# Patient Record
Sex: Female | Born: 1995 | Race: White | Hispanic: No | Marital: Single | Smoking: Former smoker
Health system: Southern US, Community
[De-identification: ages and names within clinical notes are randomized; demographics above are authoritative.]

## PROBLEM LIST (undated history)

## (undated) DIAGNOSIS — F32A Depression, unspecified: Secondary | ICD-10-CM

## (undated) DIAGNOSIS — F329 Major depressive disorder, single episode, unspecified: Secondary | ICD-10-CM

## (undated) HISTORY — DX: Depression, unspecified: F32.A

## (undated) HISTORY — DX: Major depressive disorder, single episode, unspecified: F32.9

---
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
## 2015-07-06 ENCOUNTER — Ambulatory Visit
Admission: RE | Admit: 2015-07-06 | Discharge: 2015-07-06 | Disposition: A | Discharge status: Home / Self Care | Payer: BC Managed Care – PPO | Source: Ambulatory Visit | Attending: Family Medicine | Admitting: Family Medicine

## 2015-07-06 ENCOUNTER — Other Ambulatory Visit: Payer: Self-pay | Admitting: Family Medicine

## 2015-07-06 DIAGNOSIS — S060X0A Concussion without loss of consciousness, initial encounter: Secondary | ICD-10-CM

## 2015-09-02 ENCOUNTER — Other Ambulatory Visit: Payer: Self-pay | Admitting: Orthopedic Surgery

## 2015-09-02 DIAGNOSIS — R52 Pain, unspecified: Secondary | ICD-10-CM

## 2015-09-02 DIAGNOSIS — R609 Edema, unspecified: Secondary | ICD-10-CM

## 2015-09-07 ENCOUNTER — Ambulatory Visit
Admission: RE | Admit: 2015-09-07 | Discharge: 2015-09-07 | Disposition: A | Discharge status: Home / Self Care | Payer: BC Managed Care – PPO | Source: Ambulatory Visit | Attending: Orthopedic Surgery | Admitting: Orthopedic Surgery

## 2015-09-07 DIAGNOSIS — R609 Edema, unspecified: Secondary | ICD-10-CM

## 2015-09-07 DIAGNOSIS — R52 Pain, unspecified: Secondary | ICD-10-CM

## 2016-11-14 HISTORY — PX: MENISCUS REPAIR: SHX5179

## 2017-11-03 IMAGING — MR MR KNEE*R* W/O CM
4 of 5 series · 32 of 40 positions shown · non-contrast
Comparison: None.

CLINICAL DATA: Medial right knee pain and difficulty bearing
weight. The patient had a twisting injury of the right knee 3 weeks
ago playing soccer. Initial encounter.

EXAM:
MRI OF THE RIGHT KNEE WITHOUT CONTRAST
TECHNIQUE: Multiplanar, multisequence MR imaging of the knee was performed. No
intravenous contrast was administered.

[Series 6: PD fat-sat · axial · right · 3.0mm · 0.39mm/px · z∈[-33,+81]mm · 9 of 36 slices shown (1 of 3)]
[im 1/36]
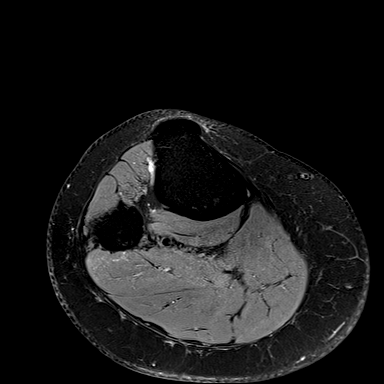
[im 5/36]
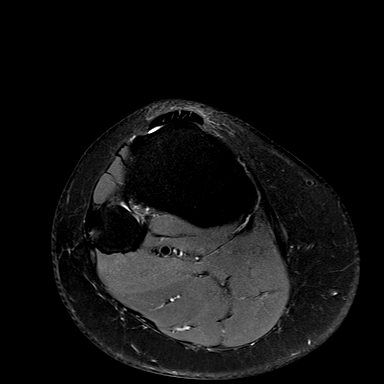
[im 9/36]
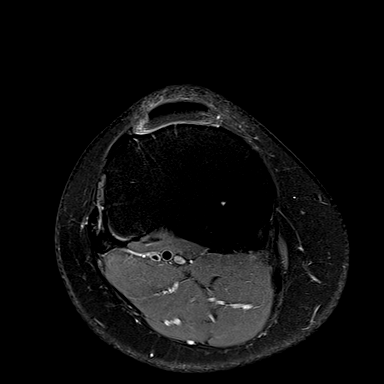
[im 14/36]
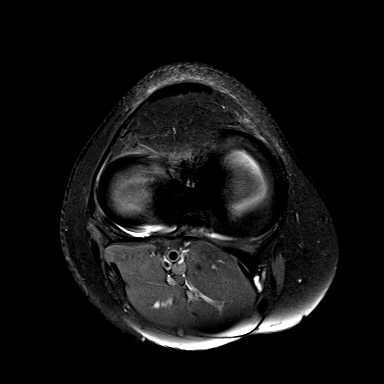
[im 18/36]
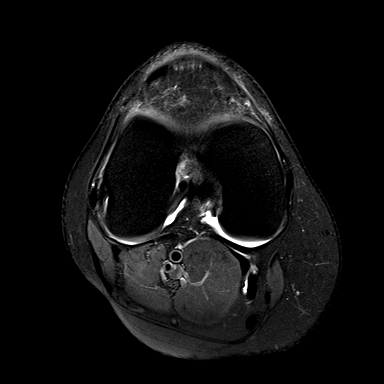
[im 22/36]
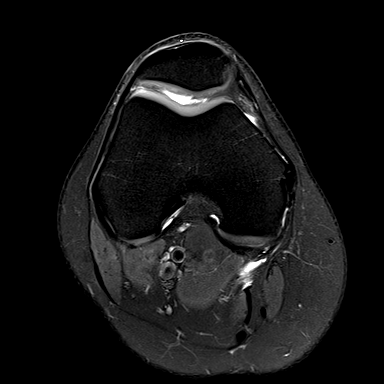
[im 27/36]
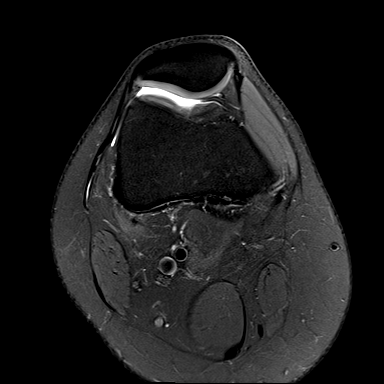
[im 31/36]
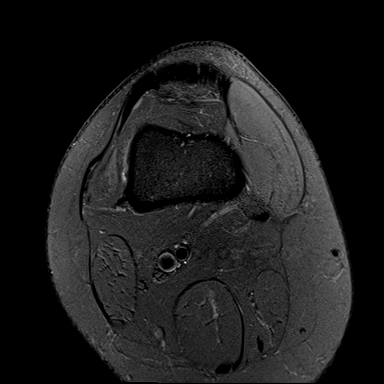
[im 36/36]
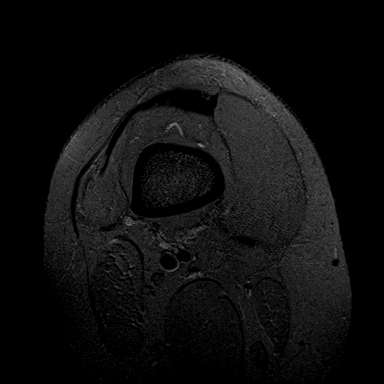

[Series 8: PD fat-sat · coronal · right · 3.0mm · 0.33mm/px · 8 of 33 slices shown (2 of 3)]
[im 1/33]
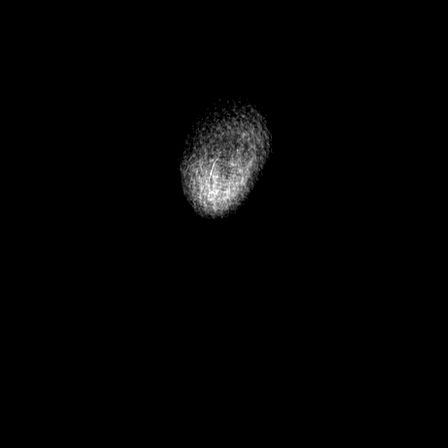
[im 5/33]
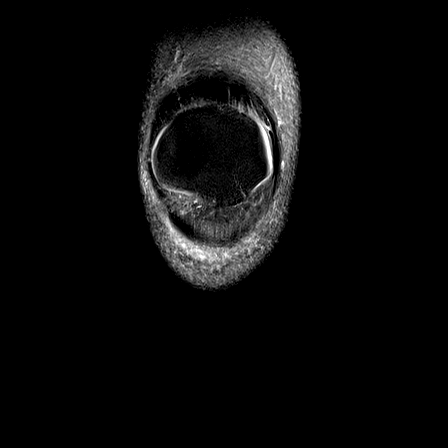
[im 10/33]
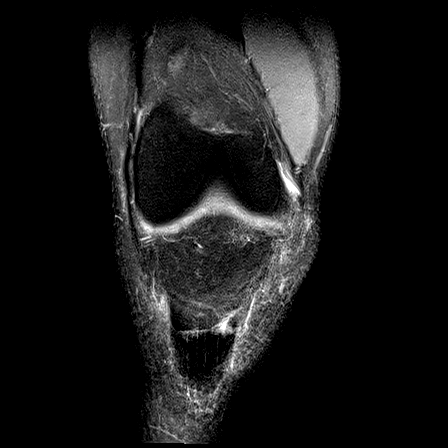
[im 14/33]
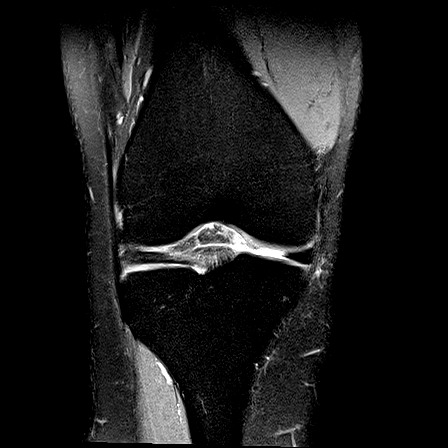
[im 19/33]
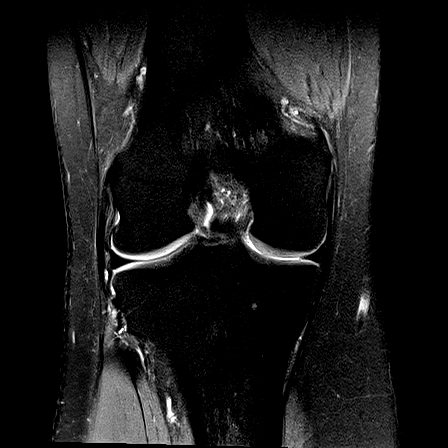
[im 23/33]
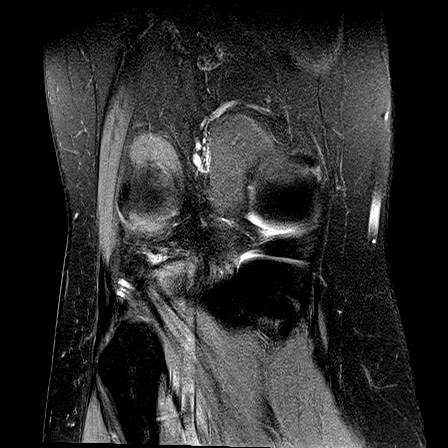
[im 28/33]
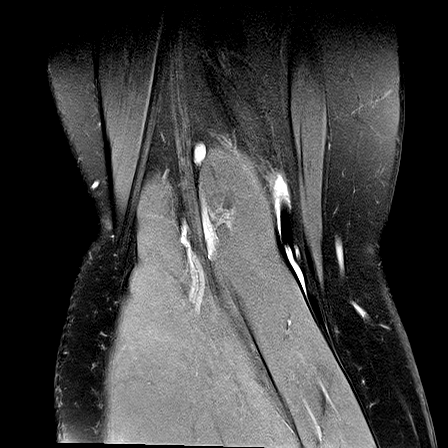
[im 33/33]
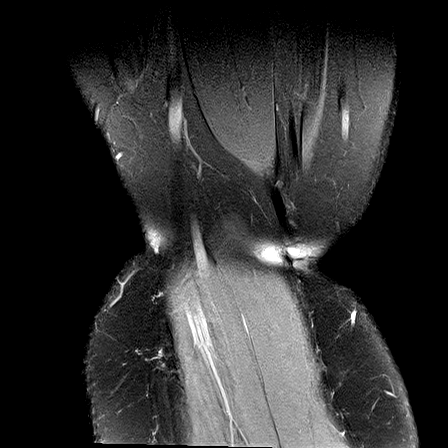

[Series 9: PD fat-sat · sagittal · right · 3.0mm · 0.39mm/px · 7 of 27 slices shown (3 of 3)]
[im 1/27]
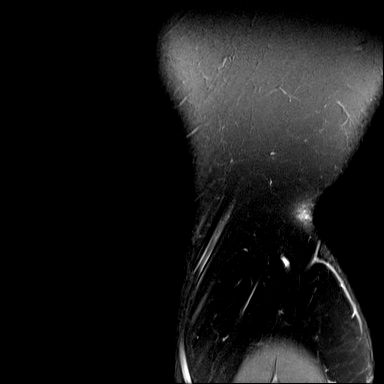
[im 5/27]
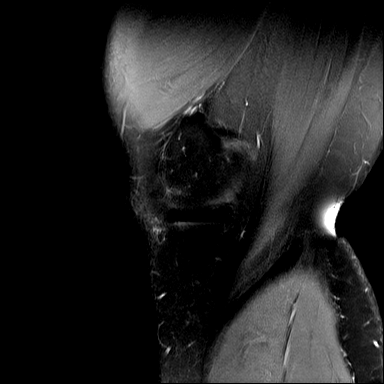
[im 9/27]
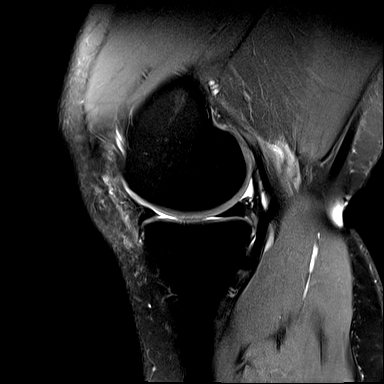
[im 14/27]
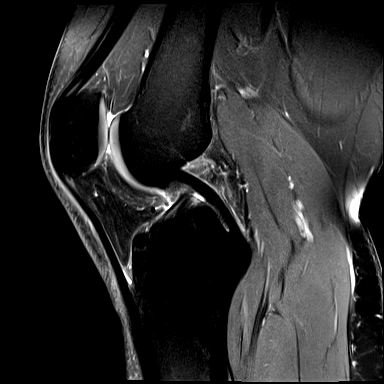
[im 18/27]
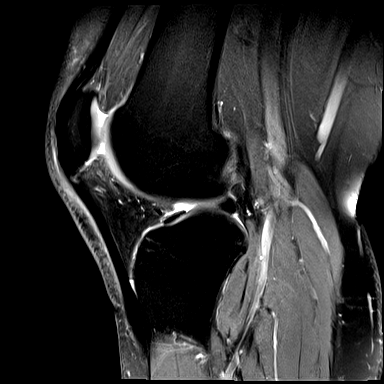
[im 22/27]
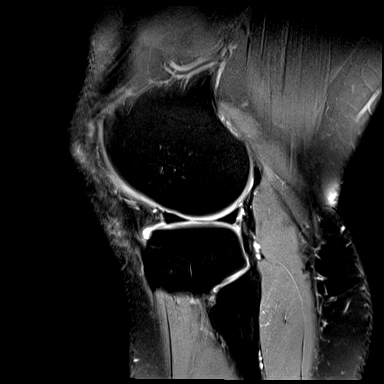
[im 27/27]
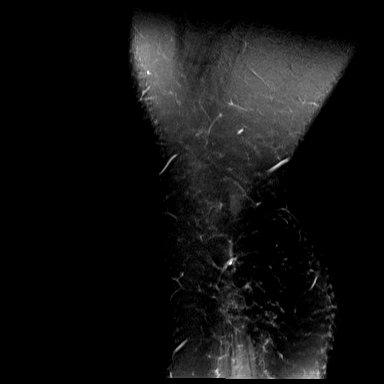

[Series 10: T2 fat-sat · coronal · right · 3.0mm · 0.39mm/px · 8 of 33 slices shown]
[im 1/33]
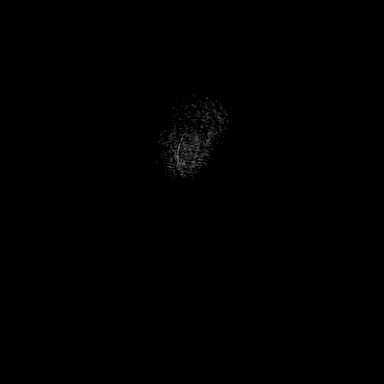
[im 5/33]
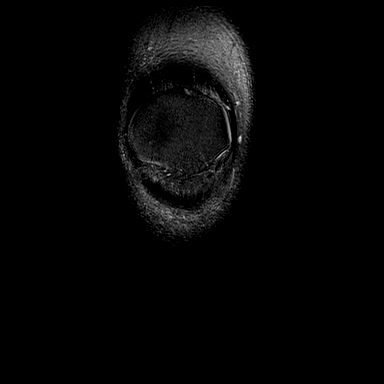
[im 10/33]
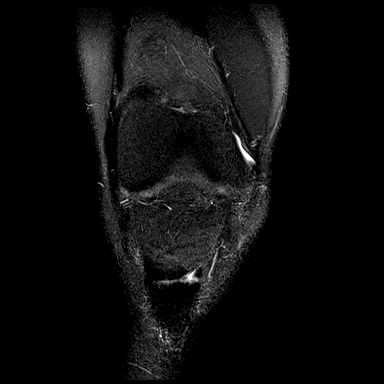
[im 14/33]
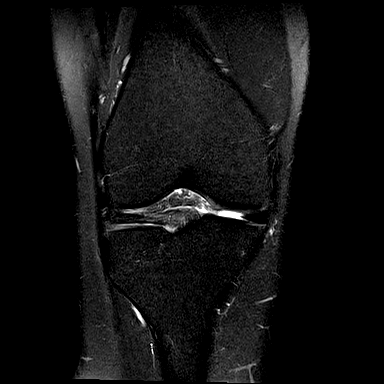
[im 19/33]
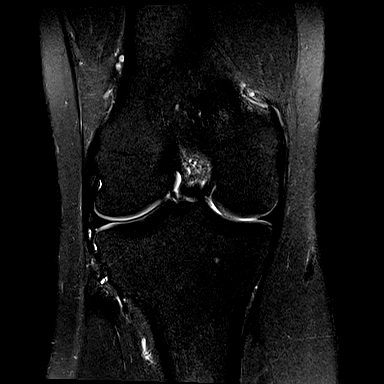
[im 23/33]
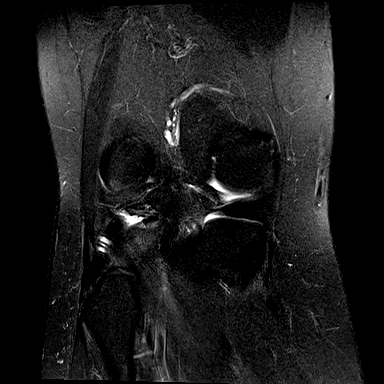
[im 28/33]
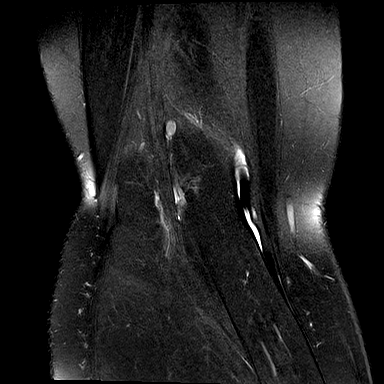
[im 33/33]
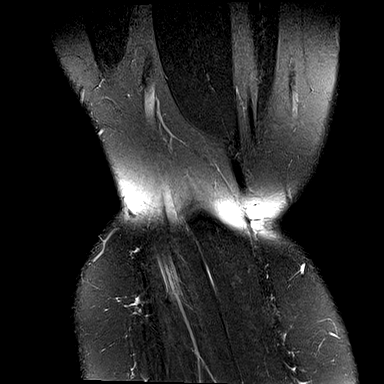

[32 of 40 positions shown; findings below may reference images not displayed]

FINDINGS: MENISCI

Medial meniscus:  Intact.

Lateral meniscus:  Intact.

LIGAMENTS

Cruciates:  Intact.

Collaterals:  Intact.

CARTILAGE

Patellofemoral:  Unremarkable.

Medial:  Unremarkable.

Lateral:  Unremarkable.

Joint:  Trace amount of joint fluid.

Popliteal Fossa:  Tiny Baker's cyst.

Extensor Mechanism:  Intact.

Bones: Small degenerative cyst is seen anterior to the ACL
attachment to the tibia. Bone marrow signal is otherwise normal.
IMPRESSION: Negative for meniscal or ligament tear. Tiny joint effusion is
noted.

## 2018-05-15 ENCOUNTER — Other Ambulatory Visit (HOSPITAL_COMMUNITY): Payer: BC Managed Care – PPO | Attending: Psychiatry | Admitting: Licensed Clinical Social Worker

## 2018-05-15 DIAGNOSIS — F3181 Bipolar II disorder: Secondary | ICD-10-CM | POA: Diagnosis not present
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
## 2018-05-19 ENCOUNTER — Other Ambulatory Visit (HOSPITAL_COMMUNITY): Payer: BC Managed Care – PPO | Attending: Psychiatry | Admitting: Licensed Clinical Social Worker

## 2018-05-19 ENCOUNTER — Encounter (HOSPITAL_COMMUNITY): Payer: Self-pay | Admitting: Occupational Therapy

## 2018-05-19 ENCOUNTER — Other Ambulatory Visit: Payer: Self-pay

## 2018-05-19 ENCOUNTER — Other Ambulatory Visit (HOSPITAL_COMMUNITY): Payer: BC Managed Care – PPO | Admitting: Occupational Therapy

## 2018-05-19 DIAGNOSIS — F070 Personality change due to known physiological condition: Secondary | ICD-10-CM | POA: Insufficient documentation

## 2018-05-19 DIAGNOSIS — R4589 Other symptoms and signs involving emotional state: Secondary | ICD-10-CM

## 2018-05-19 DIAGNOSIS — F3181 Bipolar II disorder: Secondary | ICD-10-CM | POA: Insufficient documentation

## 2018-05-19 NOTE — Therapy (Signed)
[place], [place], [address] Phone: [phone]   Fax:  [phone]  Occupational Therapy Evaluation  Patient Details  Name: Karina [name] MRN: [id number] Date of Birth: 1996-01-25 Referring Provider (OT): Ricky Ala, NP   Encounter Date: 05/19/2018  OT End of Session - 05/19/18 1206    Visit Number  1    Number of Visits  16    Date for OT Re-Evaluation  06/16/18    Authorization Type  BCBS    OT Start Time  1100    OT Stop Time  1145    OT Time Calculation (min)  45 min    Activity Tolerance  Patient tolerated treatment well    Behavior During Therapy  [facility] for tasks assessed/performed       History reviewed. No pertinent past medical history.  History reviewed. No pertinent surgical history.  There were no vitals filed for this visit.  Subjective Assessment - 05/19/18 1202    Currently in Pain?  No/denies        [facility] OT Assessment - 05/19/18 0001      Assessment   Medical Diagnosis  Bipolar II, most recent episode depresed    Referring Provider (OT)  Ricky Ala, NP    Onset Date/Surgical Date  05/19/18      Precautions   Precautions  None      Restrictions   Weight Bearing Restrictions  No      Balance Screen   Has the patient fallen in the past 6 months  No    Has the patient had a decrease in activity level because of a fear of falling?   No    Is the patient reluctant to leave their home because of a fear of falling?   No         OT assessment: OCAIRS  Diagnosis: Bipolar 2, current episode depressed  Past medical history/referral information: Pt presents to PHP as self referral. She reports history of one inpatient hospitalization for SI this past July. She shares she is struggling with motivation and the will to engage in her normal life. She met with a counselor once a week prior to [facility].  Living situation: Pt lives alone in a house with a dog and a  cat  ADLs/IADLs: Pt reports baseline for self care. She shares she has had a decrease in appetite and dysfunctional eating schedule. She also shares that her sleep hygiene is sporadic, either 3 hours a night or up to 12 hours a night. She does not currently exercise.  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 She will not be working during her time at [facility].  Leisure: Unable to identify current engagements.  Social support: Mentions family as very unsupportive, but her therapist, boss, and best friend as very supportive.  Struggles: leisure, social, sleep, eating  [place] Summary of Client Scores:  FACILITATES PARTICIPATION IN OCCUPATION  ALLOWS PARTICIPATION IN OCCUPATION INHIBITS PARTICIPATION IN OCCUPATION RESTRICTS PARTICIPATION IN OCCUPATION COMMENTS  ROLES             X   Able to continue to manage roles with some difficulty, work stopped only due to scheduling with PHP  HABITS                         X  Nonproductive eating routine,  has not continued to manage leisure activities or other engagement other than work  PERSONAL CAUSATION             X   Able to identify many areas of pride, difficulty anticipating progress in future  VALUES              X  Not acting on named values  INTERESTS              X  Very minimal engagement  SKILLS             X   Minor difficulties  SHORT TERM GOALS             X   Mentions 3, mostly specific  LONG TERM GOALS               X None stated  INTERPETATION OF PAST EXPERIENCES              X  Increased emphasis on negative  PHYSICAL ENVIRONMENT             X   Mentions she will occasionally be defiant and not show to appointments  SOCIAL ENVIRONMENT              X  Increased isolation, shares previously she was very social. Still contacts best friend  [place]              X  Mentions much difficulty adapting to changes both big life events  and small schedule changes    Need for Occupational Therapy:  4 Shows positive occupational participation, no need for OT.   3 Need for minimal intervention/consultative participation     X 2 Need for OT intervention indicated to restore/improve participation   1 Need for extensive OT intervention indicated to improve participation.  Referral for follow up services also recommended.    Assessment:  Patient demonstrates behavior that inhibits participation in occupation.  Patient will benefit from occupational therapy intervention in order to improve time management, financial management, stress management, job readiness skills, social skills, and health management skills in preparation to return to full time community living and to be a productive community member.    Plan:  Patient will participate in skilled occupational therapy sessions individually or in a group setting to improve coping skills, psychosocial skills, and emotional skills required to return to prior level of function. Treatment will be 4-5 times per week for 3 weeks.                   OT Short Term Goals - 05/19/18 1208      OT SHORT TERM GOAL #1   Title  Pt will be educated on strategies to improve psychosocial skills needed to participate fully in all daily, work, and leisure activities    Time  4    Period  Weeks    Status  New    Target Date  06/16/18      OT SHORT TERM GOAL #2   Title  Pt will apply psychosocial skills and coping mechanisms to daily activities in order to function independently and reintegrate into community dwelling    Time  4    Period  Weeks    Status  New    Target Date  06/16/18      OT SHORT TERM GOAL #3   Title  Pt will create and apply BADL routine to improve quality and frequency of eating balanced  meals    Time  4    Period  Weeks    Status  New    Target Date  06/16/18      OT SHORT TERM GOAL #4   Title  Pt will recall and/or apply 1-3 sleep hygiene strategies to  improve BADL routine upon community reintegration    Time  4    Period  Weeks    Status  New    Target Date  06/16/18               Plan - 05/19/18 1207    Occupational performance deficits (Please refer to evaluation for details):  ADL's;IADL's;Rest and Sleep;Work;Leisure;Social Participation    Rehab Potential  Good    OT Frequency  4x / week    OT Duration  4 weeks    OT Treatment/Interventions  Psychosocial skills training;Coping strategies training;Self-care/ADL training;Other (comment)   community integration   Consulted and Agree with Plan of Care  Patient       Patient will benefit from skilled therapeutic intervention in order to improve the following deficits and impairments:  Decreased psychosocial skills, Decreased coping skills, Other (comment)(decreased ability to engage in BADL and reintegrate into community)  Visit Diagnosis: Organic personality disorder  Difficulty coping    Problem List There are no active problems to display for this patient.  Zenovia Jarred, MSOT, OTR/L Behavioral Health OT/ Acute Relief OT PHP Office: [place] 05/19/2018, 12:19 PM  [facility] HOSPITALIZATION PROGRAM [place], [place], [address] Phone: [phone]   Fax:  [phone]  Name: KASIA TREGO MRN: [id number] Date of Birth: [birth year]

## 2018-05-20 ENCOUNTER — Other Ambulatory Visit (HOSPITAL_COMMUNITY): Payer: BC Managed Care – PPO | Admitting: Occupational Therapy

## 2018-05-20 ENCOUNTER — Encounter (HOSPITAL_COMMUNITY): Payer: Self-pay

## 2018-05-20 ENCOUNTER — Other Ambulatory Visit (HOSPITAL_COMMUNITY): Payer: BC Managed Care – PPO | Admitting: Licensed Clinical Social Worker

## 2018-05-20 ENCOUNTER — Encounter (HOSPITAL_COMMUNITY): Payer: Self-pay | Admitting: Occupational Therapy

## 2018-05-20 VITALS — BP 104/76 | HR 68 | Ht 65.25 in | Wt 153.0 lb

## 2018-05-20 DIAGNOSIS — F3181 Bipolar II disorder: Secondary | ICD-10-CM

## 2018-05-20 DIAGNOSIS — F070 Personality change due to known physiological condition: Secondary | ICD-10-CM

## 2018-05-20 DIAGNOSIS — R4589 Other symptoms and signs involving emotional state: Secondary | ICD-10-CM
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
## 2018-05-20 NOTE — Psych (Signed)
[facility] PHP THERAPIST PROGRESS NOTE  Karina [name] [id number]  Session Time: 9:00 - 10:00  Participation Level: Active  Behavioral Response: CasualAlertAnxious  Type of Therapy: Group Therapy  Treatment Goals addressed: Coping  Interventions: CBT, DBT, Solution Focused, Supportive and Reframing  Summary:  Clinician led check-in regarding current stressors and situation, and review of patient completed daily inventory. Clinician utilized active listening and empathetic response and validated patient emotions. Clinician facilitated processing group on pertinent issues.   Therapist Response: Karina [name] is a 23 y.o. female who presents with depression and anxiety symptoms. Patient arrived within time allowed and reports that she is feeling "stressed out." Patient rates her mood at a 3.5 on a scale of 1-10 with 10 being great. Pt shares that she feels that after she made the decision to come to treatment "everything went to shit." Pt states she has been gritting her teeth as a stress reaction and subsequently has a tooth ache. Pt reports having a hectic morning because her alarm did not go off and her phone is seemingly dead. Pt shares she was trying to communicate through a tablet and struggling to do so, as well as making sure everything was in place at work. Pt shares feeling discouraged and like the "universe is against me." Patient struggles to apply the insight she has. Patient engaged in discussion.       Session Time: 10:00 - 10:45  Participation Level: Active  Behavioral Response: CasualAlertDepressed  Type of Therapy: Individual therapy  Treatment Goals addressed: Coping  Interventions: CBT, DBT, Solution Focused, Supportive and Reframing  Summary:  Clinician engaged pt in discussion on her goals for treatment and where pt feels she needs improvement.   Therapist Response: Pt reports goal of treatment as being more honest about her symptoms and making herself a  higher priority. Pt shares history of finding self worth in being capable, high achieving, and making people feel good. Pt states patterns of neglecting self care, having poor boundaries, and continuing to ignore her body and mind's needs. Pt reports these often lead to periods of burn out due to this built up neglect, and often intense SI is also at the end of that road. Pt has significant insight into her history and actions however is also aware that there is little action behind her insight. Pt struggles to understand why the action step is difficult for her and is somewhat resistant to trying new strategies to increase action. Pt agrees to look for baby steps.         Session Time: 10:45 - 11:30    Participation Level: Active   Behavioral Response: CasualAlertDepressed   Type of Therapy:  OT   Treatment Goals addressed: Coping   Interventions: Psychosocial skills training, Supportive,    Summary:  Occupational Therapy    Therapist Response: Patient engaged insession. See OT note.         Session Time: 11:30 - 12:15  Participation Level: Active  Behavioral Response: CasualAlertDepressed  Type of Therapy: Group Therapy  Treatment Goals addressed: Coping  Interventions: Psychologist, occupational, Supportive  Summary:  Reflection Group: Patients encouraged to practice skills and interpersonal techniques or work on mindfulness and relaxation techniques. The importance of self-care and making skills part of a routine to increase usage were stressed   Therapist Response: Patient engaged and participated appropriately.         Session Time: 12:15- 1:15  Participation Level: Active  Behavioral Response: CasualAlertDepressed  Type of  Therapy: Group Therapy, Psychoeducation; Psychotherapy  Treatment Goals addressed: Coping  Interventions: CBT; Solution focused; Supportive; Reframing  Summary: 12:00 - 12:50 Clinician introduced topic of needs. Group completed a  needs assessment and discussed how to increase meeting basic needs by adding baby steps to routine. 12:50 -1:00 Clinician led check-out. Clinician assessed for immediate needs, medication compliance and efficacy, and safety concerns   Therapist Response: Patient engaged activity and discussion. Pt reports biggest need that needs improvement to be increasing self esteem, self respect, and standards of performance.  At check-out, patient rates her mood at a 5 on a scale of 1-10 with 10 being great. Patient reports afternoon plans of trying to get her phone fixed. Patient demonstrates some progress as evidenced by participating in first group session. Patient denies SI/HI/self-harm at the end of group.     Suicidal/Homicidal: Nowithout intent/plan  Plan: Pt will continue in PHP while working to decrease depression and anxiety symptoms, increase ability to manage in a healthy manner, and increase willingness to utilize skills.   Diagnosis: Bipolar 2 disorder (HCC) [F31.81]    1. Bipolar 2 disorder (HCC)       [name], LCSW 05/20/2018

## 2018-05-20 NOTE — Therapy (Signed)
[facility] PARTIAL HOSPITALIZATION PROGRAM [address] [place], [place], [address] Phone: [phone]   Fax:  [phone]  Occupational Therapy Treatment  Patient Details  Name: Karina [name] MRN: [id number] Date of Birth: October 17, 1995 Referring Provider (OT): Hillery Jacks, NP   Encounter Date: 05/20/2018  OT End of Session - 05/20/18 1306    Visit Number  2    Number of Visits  16    Date for OT Re-Evaluation  06/16/18    Authorization Type  BCBS    OT Start Time  1100    OT Stop Time  1200    OT Time Calculation (min)  60 min    Activity Tolerance  Patient tolerated treatment well    Behavior During Therapy  [facility] for tasks assessed/performed       Past Medical History:  Diagnosis Date  . Depression     Past Surgical History:  Procedure Laterality Date  . MENISCUS REPAIR Right 11/14/2016    There were no vitals filed for this visit.  Subjective Assessment - 05/20/18 1305    Currently in Pain?  No/denies        S: "I have a routine, but I do not stick to it. And I sleep sporadically"  O: Pt educated on sleep hygiene as it pertains to daily life/routines this date. Education given on appropriate sleep routines, sleep disorders, detriments of too much/too little sleep with encouraged feedback of personal Experiences. Sleep diary handout given to challenge pt to track current habits and identify area for change. Further education given on relaxation techniques to implement before bed. Pt asked to identify one STG in relation to sleep hygiene to create better daily sleep habits.   A: Pt presents to group with appropriate affect, engaged and participatory throughout entirety of session. Education received in a positive manner to help improve sleep hygiene in daily life. During education, pt shares that she has established a routine on prior dates, but does not consistently implement it. Pt agreeable to use sleep diary this date. Pt identified goal  of creating and maintaining a healthy bed time routine to improve sleep hygiene this date and to get up out of the bed after 20 minutes of sleeplessness to produce an additional healthy sleep hygiene habit.   P: Pt provided with skills to increase sleep hygiene habits into daily routine. OT will continue to follow up with communication skills for successful implementation into daily life.                      OT Education - 05/20/18 1306    Education Details  education given on sleep hygiene strategies    Person(s) Educated  Patient    Methods  Explanation;Handout    Comprehension  Verbalized understanding       OT Short Term Goals - 05/20/18 1312      OT SHORT TERM GOAL #1   Title  Pt will be educated on strategies to improve psychosocial skills needed to participate fully in all daily, work, and leisure activities    Time  4    Period  Weeks    Status  On-going    Target Date  06/16/18      OT SHORT TERM GOAL #2   Title  Pt will apply psychosocial skills and coping mechanisms to daily activities in order to function independently and reintegrate into community dwelling    Time  4    Period  Weeks    Status  On-going    Target Date  06/16/18      OT SHORT TERM GOAL #3   Title  Pt will create and apply BADL routine to improve quality and frequency of eating balanced meals    Time  4    Period  Weeks    Status  On-going    Target Date  06/16/18      OT SHORT TERM GOAL #4   Title  Pt will recall and/or apply 1-3 sleep hygiene strategies to improve BADL routine upon community reintegration    Time  4    Period  Weeks    Status  On-going    Target Date  06/16/18               Plan - 05/20/18 1312    Occupational performance deficits (Please refer to evaluation for details):  ADL's;IADL's;Rest and Sleep;Work;Leisure;Social Participation       Patient will benefit from skilled therapeutic intervention in order to improve the following deficits and  impairments:  Decreased psychosocial skills, Decreased coping skills, Other (comment)(decreased ability to engage in BADL and reintegrate into community)  Visit Diagnosis: Organic personality disorder  Difficulty coping    Problem List There are no active problems to display for this patient.  Dalphine HandingKaylee Emely Fahy, MSOT, OTR/L Behavioral Health OT/ Acute Relief OT PHP Office: [phone]  Dalphine HandingKaylee Idabell Picking 05/20/2018, 1:21 PM  [facility] PART[facility] PROGRAM [address] [place], [place], [address] Phone: [phone]   Fax:  [phone]  Name: Karina [name] MRN: [id number] Date of Birth: 09/04/1995

## 2018-05-21 ENCOUNTER — Other Ambulatory Visit (HOSPITAL_COMMUNITY): Payer: BC Managed Care – PPO | Admitting: Licensed Clinical Social Worker

## 2018-05-21 ENCOUNTER — Encounter (HOSPITAL_COMMUNITY): Payer: Self-pay | Admitting: Family

## 2018-05-21 DIAGNOSIS — F070 Personality change due to known physiological condition: Secondary | ICD-10-CM | POA: Diagnosis not present

## 2018-05-21 DIAGNOSIS — F3181 Bipolar II disorder: Secondary | ICD-10-CM

## 2018-05-22 ENCOUNTER — Other Ambulatory Visit (HOSPITAL_COMMUNITY): Payer: BC Managed Care – PPO | Admitting: Occupational Therapy

## 2018-05-22 ENCOUNTER — Encounter (HOSPITAL_COMMUNITY): Payer: Self-pay | Admitting: Occupational Therapy

## 2018-05-22 ENCOUNTER — Other Ambulatory Visit (HOSPITAL_COMMUNITY): Payer: BC Managed Care – PPO | Admitting: Licensed Clinical Social Worker

## 2018-05-22 DIAGNOSIS — F070 Personality change due to known physiological condition: Secondary | ICD-10-CM | POA: Diagnosis not present

## 2018-05-22 DIAGNOSIS — F3181 Bipolar II disorder: Secondary | ICD-10-CM

## 2018-05-22 DIAGNOSIS — R4589 Other symptoms and signs involving emotional state: Secondary | ICD-10-CM

## 2018-05-22 NOTE — Therapy (Signed)
[facility] [address] [place], [place], [address] Phone: [phone]   Fax:  [phone]  Occupational Therapy Treatment  Patient Details  Name: Karina [name] MRN: [id number] Date of Birth: 1995/10/18 Referring Provider (OT): Hillery Jacksanika Lewis, NP   Encounter Date: 05/22/2018  OT End of Session - 05/22/18 1356    Visit Number  3    Number of Visits  16    Date for OT Re-Evaluation  06/16/18    Authorization Type  BCBS    OT Start Time  1100    OT Stop Time  1200    OT Time Calculation (min)  60 min    Activity Tolerance  Patient tolerated treatment well    Behavior During Therapy  [facility] for tasks assessed/performed       Past Medical History:  Diagnosis Date  . Depression     Past Surgical History:  Procedure Laterality Date  . MENISCUS REPAIR Right 11/14/2016    There were no vitals filed for this visit.  Subjective Assessment - 05/22/18 1355    Currently in Pain?  No/denies       S: "I do this at work"   O: Education given on goal setting to help begin to formulate productive engagement in BADL routines. Pt given goal identifying worksheet to list immediate, short term, medium term, and long-term goals using a SMART goal framework (specificity, meaningful, adaptive, realistic, and time bound). Goals created as guideline for pt to practice being accountable in various situations. Pt asked to share goals with group. Art activity then implemented for pt to create vision board of goals. Various mediums of art offered to meet pt preferences and needs. Projects shared at end of session.  A: Pt presents to group with blunted affect, engaged and participatory throughout session. 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 Pt completed goal setting with focus on leisure, eating, and maintaining a healthy exercise routine. Pt reports she has been consistent in her eating 2 meals per day goal.  Pt completed art activity with much success, noting increased affect.  P: OT will continue to follow up with pt in regards to goal setting while pt is implementing BADL routine changes.                  OT Education - 05/22/18 1356    Education Details  education given on goal setting    Person(s) Educated  Patient    Methods  Explanation;Handout    Comprehension  Verbalized understanding       OT Short Term Goals - 05/20/18 1312      OT SHORT TERM GOAL #1   Title  Pt will be educated on strategies to improve psychosocial skills needed to participate fully in all daily, work, and leisure activities    Time  4    Period  Weeks    Status  On-going    Target Date  06/16/18      OT SHORT TERM GOAL #2   Title  Pt will apply psychosocial skills and coping mechanisms to daily activities in order to function independently and reintegrate into community dwelling    Time  4    Period  Weeks    Status  On-going    Target Date  06/16/18      OT SHORT TERM GOAL #3   Title  Pt will create and apply BADL routine to improve  quality and frequency of eating balanced meals    Time  4    Period  Weeks    Status  On-going    Target Date  06/16/18      OT SHORT TERM GOAL #4   Title  Pt will recall and/or apply 1-3 sleep hygiene strategies to improve BADL routine upon community reintegration    Time  4    Period  Weeks    Status  On-going    Target Date  06/16/18               Plan - 05/22/18 1400    Occupational performance deficits (Please refer to evaluation for details):  ADL's;IADL's;Rest and Sleep;Work;Leisure;Social Participation       Patient will benefit from skilled therapeutic intervention in order to improve the following deficits and impairments:  Decreased psychosocial skills, Decreased coping skills, Other (comment)(decreased ability to engage in BADL and reintegrate into community)  Visit Diagnosis: Organic personality disorder  Difficulty  coping    Problem List There are no active problems to display for this patient.  Dalphine Handing, MSOT, OTR/L Behavioral Health OT/ Acute Relief OT PHP Office: [phone]  Dalphine Handing 05/22/2018, 2:01 PM  [facility] HOSPITALIZATION PROGRAM [address] [place], [place], [address] Phone: [phone]   Fax:  [phone]  Name: Karina [name] MRN: [id number] Date of Birth: [birth year]

## 2018-05-22 NOTE — Psych (Signed)
Karina Karina [name], Karina Karina [name], Karina Karina [name], Karina Karina [name], and review of patient completed daily inventory. Clinician utilized active listening and empathetic response and validated patient emotions. Clinician facilitated processing group on pertinent issues.   Therapist Response: Karina Karina [name] is a 23 y.o. female who presents with depression and anxiety symptoms. Patient arrived within time allowed and reports that she is feeling "a little stressed." Patient rates her mood at a 5 on a scale of 1-10 with 10 being great. Pt states she feels she is being punished for getting help, and everything is a struggle right now. Pt reports getting a new phone was not easy and she spent all afternoon running around trying to resolve the issue. Pt reports complications from needing parental help since she is on their phone plan and parents not being timely or very helpful. Pt states she hopes to have a new phone this afternoon. Pt states she was able to talk with her friends and get some of the feelings out. Pt reports having SI last night due to the stress of the phone Karina [name]. Pt reports she "had a temper tantrum" including screaming, crying, and "lashing out." Pt states feeling a little better after the tantrum. Pt denies current SI.  Patient engaged in discussion.        Session Time: 11:00 -12:00   Participation Level: Active   Behavioral Response: CasualAlertDepressed   Type of Therapy: Group Therapy, OT   Treatment Goals addressed: Coping   Interventions: Psychosocial skills training, Karina,    Summary:  Occupational Therapy group   Therapist Response:  Patient engaged in group. See OT note.         Session Time: 12:00 - 12:45  Participation Level: Active  Behavioral Response: CasualAlertDepressed  Type of Therapy: Group Therapy, Activity Therapy  Treatment Goals addressed: Coping  Interventions: Psychologist, occupational, Karina  Summary:  Reflection Group: Patients encouraged to practice skills and interpersonal techniques or work on mindfulness and relaxation techniques. The importance of self-care and making skills part of a routine to increase usage were stressed   Therapist Response: Patient engaged and participated appropriately.         Session Time: 12:45- 2:00  Participation Level: Active  Behavioral Response: CasualAlertDepressed  Type of Therapy: Group Therapy, Psychoeducation; Psychotherapy  Treatment Goals addressed: Coping  Interventions: Karina [name]; Karina Karina [name]; Karina; Reframing  Summary: 12:45 - 1:50: Clinician introduced topic of cognitive distortions. Cln educated on what cognitive distortions are and how they affect [place]. Cln introduced "Catch, Challenge, Change" and group reviewed cognitive distortion handout and came up with examples to work on "catch." 1:50 -2:00 Clinician led check-out. Clinician assessed for immediate needs, medication compliance and efficacy, and safety concerns   Therapist Response: Patient engaged activity and discussion. Pt was able to identify real life examples of cognitive distortions discussed. Pt again struggles to apply her knowledge and feels she is good at "catch"-ing and falters during the challenge stage.   At check-out, patient rates her mood at a 7 on a scale of 1-10 with 10 being great. Patient reports afternoon plans of getting and setting up her phone.  Patient demonstrates some progress as evidenced by continued openness. Patient denies SI/HI/self-harm at the end  of group.       Suicidal/Homicidal: Nowithout intent/plan  Plan: Pt will continue in  PHP while working to decrease depression and anxiety symptoms, increase ability to manage in a healthy manner, and increase willingness to utilize skills.   Diagnosis: Bipolar 2 disorder (HCC) [F31.81]    1. Bipolar 2 disorder (HCC)       Donia GuilesJenny Zaharah Amir, LCSW 05/22/2018

## 2018-05-23 ENCOUNTER — Other Ambulatory Visit (HOSPITAL_COMMUNITY): Payer: BC Managed Care – PPO

## 2018-05-23 ENCOUNTER — Other Ambulatory Visit (HOSPITAL_COMMUNITY): Payer: BC Managed Care – PPO | Admitting: Licensed Clinical Social Worker

## 2018-05-23 DIAGNOSIS — F070 Personality change due to known physiological condition: Secondary | ICD-10-CM | POA: Diagnosis not present

## 2018-05-23 DIAGNOSIS — F3181 Bipolar II disorder: Secondary | ICD-10-CM

## 2018-05-26 ENCOUNTER — Encounter (HOSPITAL_COMMUNITY): Payer: Self-pay

## 2018-05-26 ENCOUNTER — Other Ambulatory Visit (HOSPITAL_COMMUNITY): Payer: BC Managed Care – PPO | Admitting: Occupational Therapy

## 2018-05-26 ENCOUNTER — Other Ambulatory Visit (HOSPITAL_COMMUNITY): Payer: BC Managed Care – PPO | Admitting: Licensed Clinical Social Worker

## 2018-05-26 DIAGNOSIS — R4589 Other symptoms and signs involving emotional state: Secondary | ICD-10-CM

## 2018-05-26 DIAGNOSIS — F070 Personality change due to known physiological condition: Secondary | ICD-10-CM

## 2018-05-26 DIAGNOSIS — F3181 Bipolar II disorder: Secondary | ICD-10-CM

## 2018-05-26 NOTE — Therapy (Signed)
[facility] PARTIAL HOSPITALIZATION PROGRAM [address] [place], [place], [address] Phone: [phone]   Fax:  [phone]  Occupational Therapy Treatment  Patient Details  Name: Karina [name] MRN: [id number] Date of Birth: 06/08/1995 Referring Provider (OT): Hillery Jacks, NP   Encounter Date: 05/26/2018  OT End of Session - 05/26/18 1335    Visit Number  4    Number of Visits  16    Date for OT Re-Evaluation  06/16/18    Authorization Type  BCBS    OT Start Time  1100    OT Stop Time  1200    OT Time Calculation (min)  60 min    Activity Tolerance  Patient tolerated treatment well    Behavior During Therapy  [facility] for tasks assessed/performed       Past Medical History:  Diagnosis Date  . Depression     Past Surgical History:  Procedure Laterality Date  . MENISCUS REPAIR Right 11/14/2016    There were no vitals filed for this visit.  Subjective Assessment - 05/26/18 1335    Currently in Pain?  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 Education given on the definition of stress and its cognitive, behavioral, emotional, and physical effects on the body. Stress management tool worksheet discussed to educate on unhealthy vs healthy coping skills to manage stress to improve community integration. Coping strategies taught include: relaxation based- deep breathing, counting to 10, taking a 1 minute vacation, acceptance, stress balls, relaxation audio/video, visual/mental imagery. Positive mental attitude- gratitude, acceptance, cognitive reframing, positive self talk, anger management. Gratitude journaling prompts given and discussed as a group.   A: Pt presents to group with  appropriate affect, engaged and participatory throughout entirety of session. Stress management tools worksheet completed, with pt identifying that she suppresses emotions until the point of acting out on others or herself. Pt presents with well basis of knowledge, but limited application. She shares that deep breathing is helpful, as well as planning good things for the future (in relation to goal setting activity in previous session). Further challenged pt to build these activities into routine to maintain consistency.    P: Pt provided with education on stress management activities. OT will continue to follow up with activities learned for successful implementation into daily life                     OT Education - 05/26/18 1335    Education Details  education given on stress management    Person(s) Educated  Patient    Methods  Explanation;Handout    Comprehension  Verbalized understanding       OT Short Term Goals - 05/20/18 1312      OT SHORT TERM GOAL #1   Title  Pt will be educated on strategies to improve psychosocial skills needed to participate fully in all daily, work, and leisure activities    Time  4    Period  Weeks    Status  On-going    Target Date  06/16/18      OT SHORT TERM GOAL #2   Title  Pt will apply psychosocial skills and coping mechanisms to  daily activities in order to function independently and reintegrate into community dwelling    Time  4    Period  Weeks    Status  On-going    Target Date  06/16/18      OT SHORT TERM GOAL #3   Title  Pt will create and apply BADL routine to improve quality and frequency of eating balanced meals    Time  4    Period  Weeks    Status  On-going    Target Date  06/16/18      OT SHORT TERM GOAL #4   Title  Pt will recall and/or apply 1-3 sleep hygiene strategies to improve BADL routine upon community reintegration    Time  4    Period  Weeks    Status  On-going    Target Date  06/16/18                Plan - 05/26/18 1336    Occupational performance deficits (Please refer to evaluation for details):  ADL's;IADL's;Rest and Sleep;Work;Leisure;Social Participation       Patient will benefit from skilled therapeutic intervention in order to improve the following deficits and impairments:  Decreased psychosocial skills, Decreased coping skills, Other (comment)(decreased ability to engage in BADL and reintegrate into community)  Visit Diagnosis: Organic personality disorder  Difficulty coping    Problem List There are no active problems to display for this patient.  Dalphine HandingKaylee Gregory Dowe, MSOT, OTR/L Behavioral Health OT/ Acute Relief OT PHP Office: [phone]   Dalphine HandingKaylee Dejuan Elman 05/26/2018, 1:41 PM  [facility]GRAM [address] [place], [place], [address] Phone: [phone]   Fax:  [phone]  Name: Karina [name] MRN: [id number] Date of Birth: [birth year]

## 2018-05-27 ENCOUNTER — Other Ambulatory Visit (HOSPITAL_COMMUNITY): Payer: BC Managed Care – PPO | Admitting: Licensed Clinical Social Worker

## 2018-05-27 ENCOUNTER — Encounter (HOSPITAL_COMMUNITY): Payer: Self-pay | Admitting: Family

## 2018-05-27 ENCOUNTER — Other Ambulatory Visit (HOSPITAL_COMMUNITY): Payer: BC Managed Care – PPO | Admitting: Occupational Therapy

## 2018-05-27 ENCOUNTER — Encounter (HOSPITAL_COMMUNITY): Payer: Self-pay | Admitting: Occupational Therapy

## 2018-05-27 DIAGNOSIS — F3181 Bipolar II disorder: Secondary | ICD-10-CM

## 2018-05-27 DIAGNOSIS — F070 Personality change due to known physiological condition: Secondary | ICD-10-CM

## 2018-05-27 DIAGNOSIS — R4589 Other symptoms and signs involving emotional state: Secondary | ICD-10-CM
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
## 2018-05-27 NOTE — Therapy (Signed)
[place], [place], [address] Phone: [phone]   Fax:  [phone]  Occupational Therapy Treatment  Patient Details  Name: Karina [name] MRN: [id number] Date of Birth: 1996-02-09 Referring Provider (OT): Ricky Ala, NP   Encounter Date: 05/27/2018  OT End of Session - 05/27/18 1305    Visit Number  5    Number of Visits  16    Date for OT Re-Evaluation  06/16/18    Authorization Type  BCBS    OT Start Time  1100    OT Stop Time  1200    OT Time Calculation (min)  60 min    Activity Tolerance  Patient tolerated treatment well    Behavior During Therapy  [facility] for tasks assessed/performed       Past Medical History:  Diagnosis Date  . Depression     Past Surgical History:  Procedure Laterality Date  . MENISCUS REPAIR Right 11/14/2016    There were no vitals filed for this visit.  Subjective Assessment - 05/27/18 1304    Currently in Pain?  No/denies        S: "I do better when I am making consistent lists"   O: Education received on time management to help increase occupational balance and quality of life. Education given on strategy to prioritize a "to do list" effectively, with practice during group. Pt given to do list and asked to share top priorities and lower priorities. Activities wheel activity administered with pt to identify hours devoted to: work/obligations, leisure/relaxation, self-care/caregiving, and sleep/rest. Further education given to allow pt to better balance activity wheel for increased occupational balance. Weekly planner handouts given at end of session with education on different modalities of organization (planner,?apps, computer, to do lists, etc.) so pt could choose preferred avenue. Further education given on the importance of time management in community reintegration from [facility] and how to continue using skills. Additional information given on medication  management, from the aspect of lifestyle/organizational skills. Pt given medication chart and various apps to use to help increase compliance of medication while implementing it into BADL routine.    A:?Pt presents to group with appropriate affect, engaged and participatory throughout entirety of session. Pt engaged in to do list prioritization, with enjoyment and success. Pt shares she will continue this practice, especially when returning to work. Activities wheel completed, with pt sharing that she needs more consistent time for self care and leisure. Pt made weekly schedule with self care and leisure options, so each day there is an option to choose from for engagement-while within a routine. Pt expressed apprehension for RTW while in group this date, helped problem solve within group with organizational strategies and coping skills to help the first day back to work go well. In regards to medication, pt shares she is rather unorganized and keeps all meds in her car or in her bag to take throughout the day. Pt committed to looking into using a pill organizer and downloading a routine building app this date.  P: Pt provided with education on time management skills to increase occupational balance and quality of life. OT will continue to follow up with pt for successful implementation into daily life.?                    OT Education - 05/27/18 1304    Education Details  education given on time management    Person(s) Educated  Patient  Methods  Explanation;Handout    Comprehension  Verbalized understanding       OT Short Term Goals - 05/27/18 1305      OT SHORT TERM GOAL #1   Title  Pt will be educated on strategies to improve psychosocial skills needed to participate fully in all daily, work, and leisure activities    Time  4    Period  Weeks    Status  Achieved    Target Date  06/16/18      OT SHORT TERM GOAL #2   Title  Pt will apply psychosocial skills and coping  mechanisms to daily activities in order to function independently and reintegrate into community dwelling    Time  4    Period  Weeks    Status  Achieved    Target Date  06/16/18      OT SHORT TERM GOAL #3   Title  Pt will create and apply BADL routine to improve quality and frequency of eating balanced meals    Time  4    Period  Weeks    Status  Achieved    Target Date  06/16/18      OT SHORT TERM GOAL #4   Title  Pt will recall and/or apply 1-3 sleep hygiene strategies to improve BADL routine upon community reintegration    Time  4    Period  Weeks    Status  Achieved    Target Date  06/16/18               Plan - 05/27/18 1305    Occupational performance deficits (Please refer to evaluation for details):  ADL's;IADL's;Rest and Sleep;Work;Leisure;Social Participation       Patient will benefit from skilled therapeutic intervention in order to improve the following deficits and impairments:  Decreased psychosocial skills, Decreased coping skills, Other (comment)(decreased ability to engage in BADL and reintegrate into community)  Visit Diagnosis: Organic personality disorder  Difficulty coping    Problem List There are no active problems to display for this patient.  OCCUPATIONAL THERAPY DISCHARGE SUMMARY  Visits from Start of Care: 5  Current functional level related to goals / functional outcomes: independent, pt is returning to previous OP [facility] providers for follow up.   Remaining deficits: Continuing to consistently implement skills known and learned   Education / Equipment: Education given on psychosocial skills and coping mechanisms as it applies to improving function in BADL/IADL routines in preparation for reintegrating into community. Plan: Patient agrees to discharge.  Patient goals were met. Patient is being discharged due to meeting the stated rehab goals.  ?????         Zenovia Jarred, MSOT, OTR/L Behavioral Health OT/ Acute Relief  OT PHP Office: [place] 05/27/2018, 1:08 PM  [facility] HOSPITALIZATION PROGRAM [place], [place], [address] Phone: [phone]   Fax:  [phone]  Name: RAVENNA LEGORE MRN: [id number] Date of Birth: 1996-05-07

## 2018-05-28 ENCOUNTER — Other Ambulatory Visit (HOSPITAL_COMMUNITY): Payer: BC Managed Care – PPO | Admitting: Licensed Clinical Social Worker

## 2018-05-28 DIAGNOSIS — F3181 Bipolar II disorder: Secondary | ICD-10-CM

## 2018-05-28 DIAGNOSIS — F070 Personality change due to known physiological condition: Secondary | ICD-10-CM | POA: Diagnosis not present

## 2018-05-28 NOTE — Progress Notes (Signed)
Spiritual care group 05/28/2018 11:00-12:00  Facilitated by Wilkie Ayehaplain Kanye Depree, MDiv    Group focused on topic of "self-care"  Patients engaged in facilitated discussion about topic.  Explored quotes related to self care and chose one which they agreed with and one which they disliked.  Engaged in discussion around quote choices and their experience / understanding of care for themselves.     Reframed a quote "don't focus on how stressed you are, focus on how blessed you are." as recognizing and accepting stress as appropriate reaction and also practicing self care.  Related to quote around "not having to strangle life" but rather "move with it."   Engaged with other group members around concept of not invalidating the stress we experience, but rather determining how we respond to it.

## 2018-05-29 ENCOUNTER — Other Ambulatory Visit (HOSPITAL_COMMUNITY): Payer: Self-pay

## 2018-05-29 ENCOUNTER — Ambulatory Visit (HOSPITAL_COMMUNITY): Payer: Self-pay

## 2018-05-29 NOTE — Psych (Signed)
   [facility] BH PHP THERAPIST PROGRESS NOTE  Karina [name] [id number]  Session Time: 9:00 - 11:00  Participation Level: Active  Behavioral Response: CasualAlertAnxious  Type of Therapy: Group Therapy  Treatment Goals addressed: Coping  Interventions: CBT, DBT, Solution Focused, Supportive and Reframing  Summary:  Clinician led check-in regarding current stressors and situation, and review of patient completed daily inventory. Clinician utilized active listening and empathetic response and validated patient emotions. Clinician facilitated processing group on pertinent issues.   Therapist Response: Karina [name] is a 23 y.o. female who presents with depression and anxiety symptoms. Patient arrived within time allowed and reports that she is feeling "above average." Patient rates her mood at a 7 on a scale of 1-10 with 10 being great. Pt states she finally got a new phone after "quite the ordeal." Pt reports she spent the rest of the afternoon setting up her phone and was glad to have access to work email again. Pt reports she suspects something is going on at work due to one of the emails and that she is having struggles trusting that there is nothing wrong despite asking her supervisor. Pt engaged in discussion of healthy boundaries with work and how to make it less available on her phone. Cln continues to struggle with trust. Patient engaged in discussion.       Session Time: 11:00 -12:15  Participation Level: Active  Behavioral Response: CasualAlertDepressed  Type of Therapy: Group Therapy, psychotherapy  Treatment Goals addressed: Coping  Interventions: Strengths based, reframing, Supportive,   Summary:  Spiritual Care group  Therapist Response: Patient engaged in group. See chaplain note.          Session Time: 12:15 - 1:00  Participation Level: Active  Behavioral Response: CasualAlertDepressed  Type of Therapy: Group Therapy  Treatment Goals addressed:  Coping  Interventions: Psychologist, occupational, Supportive  Summary:  Reflection Group: Patients encouraged to practice skills and interpersonal techniques or work on mindfulness and relaxation techniques. The importance of self-care and making skills part of a routine to increase usage were stressed   Therapist Response: Patient engaged and participated appropriately.         Session Time: 1:00- 2:00  Participation Level: Active  Behavioral Response: CasualAlertDepressed  Type of Therapy: Group Therapy, Psychoeducation  Treatment Goals addressed: Coping  Interventions: relaxation training; Supportive; Reframing  Summary: 12:45 - 1:50: Relaxation group: Cln led group focused on retraining the body's response to stress.   1:50 -2:00 Clinician led check-out. Clinician assessed for immediate needs, medication compliance and efficacy, and safety concerns   Therapist Response: Patient engaged in activity and discussion. At check-out, patient rates her mood at a 6 on a scale of 1-10 with 10 being great. Patient reports afternoon plans of cleaning and washing her hair. Patient demonstrates some progress as evidenced by willingness to consider setting boundaries with work while in treatment. Patient denies SI/HI/self-harm thoughts at the end of group.      Suicidal/Homicidal: Nowithout intent/plan  Plan: Pt will continue in PHP while working to decrease depression and anxiety symptoms, increase ability to manage in a healthy manner, and increase willingness to utilize skills.   Diagnosis: Bipolar 2 disorder (HCC) [F31.81]    1. Bipolar 2 disorder (HCC)       [name], LCSW 05/29/2018

## 2018-05-30 ENCOUNTER — Other Ambulatory Visit (HOSPITAL_COMMUNITY): Payer: Self-pay

## 2018-05-30 ENCOUNTER — Ambulatory Visit (HOSPITAL_COMMUNITY): Payer: Self-pay

## 2018-05-30 NOTE — Psych (Signed)
[facility] BH PHP THERAPIST PROGRESS NOTE  Karina [name] [id number]  Session Time: 9:00 - 11:00  Participation Level: Active  Behavioral Response: CasualAlertAnxious  Type of Therapy: Group Therapy  Treatment Goals addressed: Coping  Interventions: CBT, DBT, Solution Focused, Supportive and Reframing  Summary:  Clinician led check-in regarding current stressors and situation, and review of patient completed daily inventory. Clinician utilized active listening and empathetic response and validated patient emotions. Clinician facilitated processing group on pertinent issues.   Therapist Response: Karina [name] is a 23 y.o. female who presents with depression and anxiety symptoms. Patient arrived within time allowed and reports that she is feeling "things are going well." Patient rates her mood at a 7 on a scale of 1-10 with 10 being great. Pt reports she spent the afternoon at home cleaning and taking care of the pets. Pt shares it was easier for her to eat dinner and feels her appetite is improving. Pt shares feeling some benefit from not having to focus on multiple things since work is on hold at the moment. Pt able to recognize how mindfulness could help her achieve this regardless of external situation and was slightly resistant, stating she didn't "jive" with it in the past. Pt continues to struggle with application. Patient engaged in discussion.        Session Time: 11:00 -12:00   Participation Level: Active   Behavioral Response: CasualAlertDepressed   Type of Therapy: Group Therapy, OT   Treatment Goals addressed: Coping   Interventions: Psychosocial skills training, Supportive,    Summary:  Occupational Therapy group   Therapist Response: Patient engaged in group. See OT note.         Session Time: 12:00 - 12:45  Participation Level: Active  Behavioral Response: CasualAlertDepressed  Type of Therapy: Group Therapy, Activity Therapy  Treatment Goals  addressed: Coping  Interventions: Psychologist, occupational, Supportive  Summary:  Reflection Group: Patients encouraged to practice skills and interpersonal techniques or work on mindfulness and relaxation techniques. The importance of self-care and making skills part of a routine to increase usage were stressed   Therapist Response: Patient engaged and participated appropriately.         Session Time: 12:45- 2:00  Participation Level: Active  Behavioral Response: CasualAlertDepressed  Type of Therapy: Group Therapy, Psychoeducation; Psychotherapy  Treatment Goals addressed: Coping  Interventions: CBT; Solution focused; Supportive; Reframing  Summary: 12:45 - 1:50: Cln continued topic of cognitive distortions. Group discussed how to "challenge" the unhealthy thought patterns once recognized. Group reviewed "Socratic Questions" handout and went over how to challenge irrational thoughts using that handout. 1:50 -2:00 Clinician led check-out. Clinician assessed for immediate needs, medication compliance and efficacy, and safety concerns   Therapist Response: Patient engaged in activity. Pt states understanding of how to challenge unhealthy thoughts and successfully reframed examples in discussion. Pt states that she tends to argue with the challenges and reports she can try to utilize distractions at that point. At check-out, patient rates her mood at a 7 on a scale of 1-10 with 10 being great. Patient reports no specific afternoon plans. Patient demonstrates some progress as evidenced by responding well to challenges from cln and group members. Patient denies SI/HI/self-harm at the end of group.       Suicidal/Homicidal: Nowithout intent/plan  Plan: Pt will continue in PHP while working to decrease depression and anxiety symptoms, increase ability to manage in a healthy manner, and increase willingness to utilize skills.    Diagnosis: Bipolar 2  disorder (HCC) [F31.81]    1.  Bipolar 2 disorder (HCC)       [name], LCSW 05/30/2018

## 2018-06-02 ENCOUNTER — Ambulatory Visit (HOSPITAL_COMMUNITY): Payer: Self-pay

## 2018-06-02 ENCOUNTER — Other Ambulatory Visit (HOSPITAL_COMMUNITY): Payer: Self-pay

## 2018-06-03 ENCOUNTER — Ambulatory Visit (HOSPITAL_COMMUNITY): Payer: Self-pay

## 2018-06-03 ENCOUNTER — Other Ambulatory Visit (HOSPITAL_COMMUNITY): Payer: Self-pay

## 2018-06-04 ENCOUNTER — Other Ambulatory Visit (HOSPITAL_COMMUNITY): Payer: Self-pay

## 2018-06-05 ENCOUNTER — Other Ambulatory Visit (HOSPITAL_COMMUNITY): Payer: Self-pay

## 2018-06-05 ENCOUNTER — Ambulatory Visit (HOSPITAL_COMMUNITY): Payer: Self-pay

## 2018-06-05 NOTE — Psych (Signed)
[name] PHP THERAPIST PROGRESS NOTE  Karina [name] [id number]  Session Time: 9:00 - 10:00  Participation Level: Active  Behavioral Response: CasualAlertAnxious  Type of Therapy: Group Therapy  Treatment Goals addressed: Coping  Interventions: CBT, DBT, Solution Focused, Supportive and Reframing  Summary:  Clinician led check-in regarding current stressors and situation, and review of patient completed daily inventory. Clinician utilized active listening and empathetic response and validated patient emotions. Clinician facilitated processing group on pertinent issues.   Therapist Response: Karina [name] is a 23 y.o. female who presents with depression and anxiety symptoms. Patient arrived within time allowed and reports that she is feeling "pretty good overall." Patient rates her mood at a 6 on a scale of 1-10 with 10 being great. Pt reports passive SI over the evening with thoughts of hopelessness and wondering "what is the point?" Pt reports she woke up this morning feeling like she is getting a cold and began taking Emer[employer]-C to address it. Pt states she attempted to pack for her weekend meeting and is struggling with anxiety about feeling "trapped" at this weekend meeting. Pt able to brainstorm ways to create space for her needs while around people on the trip. Patient engaged in discussion.        Session Time: 10:00 - 11:00  Participation Level:Active  Behavioral Response:CasualAlertDepressed  Type of Therapy: Group Therapy, Psychotherapy  Treatment Goals addressed: Coping  Interventions:CBT, Solution focused, Supportive, Reframing  Summary: Cln led discussion on radical acceptance. Cln educated on the concept of radical acceptance and the process of application. Group members identified one "should" that is creating a barrier in their life and how to apply radical acceptance to it.   Therapist Response: Patient engaged in group.Pt reports should of "I  should be fine" and "I shouldn't feel this way."  Pt is able to reframe to allow that she is being hard on herself and she is entitled to bad periods. Pt reports struggling with wanting to "feel" ready or better and not just be able to rationalize and know facts logically. Pt is able to process.          Session Time: 11:00 - 12:00  Participation Level:Active  Behavioral Response:CasualAlertDepressed  Type of Therapy: Group Therapy, Psychotherapy  Treatment Goals addressed: Coping  Interventions:CBT, Solution focused, Supportive, Reframing  Summary: Cln introduced topic of boundaries. Cln provided psychoeducation on what boundaries are, porous, rigid and healthy boundary characteristics, and the different types of boundaries. Group related the information to themselves to begin discovering potential boundary issues.  Therapist Response: Patient engaged in group.Pt reports understanding of boundaries and shares she has mainly porous boundaries. 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           Session Time: 12:00- 1:00  Participation Level:Active  Behavioral Response:CasualAlertDepressed  Type of Therapy: Group Therapy, Psychoeducation; Psychotherapy  Treatment Goals addressed: Coping  Interventions:CBT; Solution focused; Supportive; Reframing  Summary:12:00 - 12:50 Clinician provided education on how to set boundaries. Cln utilized handout "How to set a Boundary" and "Healthy Boundary setting." Pt's shared boundary issues and group discussed how to handle them.  12:50 -1:00 Clinician led check-out. Clinician assessed for immediate needs, medication compliance and efficacy, and safety concerns   Therapist Response:Patient engaged activity and discussion. Pt reports understanding of how to set a boundary and is  able to come up with personal examples.  At check-out, patient rates her mood at Karina [name] a scale of 1-10 with 10 being great. Patient reports afternoon plans of continuing to pack and then travel. 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 Patient demonstrates some progress as evidenced by becoming the most transparent and honest as she has been in group thus far. Patient denies SI/HI/self-harm at the end of group.       Suicidal/Homicidal: Nowithout intent/plan  Plan: Pt will continue in PHP while working to decrease depression and anxiety symptoms, increase ability to manage in a healthy manner, and increase willingness to utilize skills.    Diagnosis: Bipolar 2 disorder (HCC) [F31.81]    1. Bipolar 2 disorder (HCC)       [name], LCSW 06/05/2018

## 2018-06-06 ENCOUNTER — Other Ambulatory Visit (HOSPITAL_COMMUNITY): Payer: Self-pay

## 2018-06-06 ENCOUNTER — Ambulatory Visit (HOSPITAL_COMMUNITY): Payer: Self-pay

## 2018-06-06 ENCOUNTER — Telehealth (HOSPITAL_COMMUNITY): Payer: Self-pay | Admitting: Professional

## 2018-06-06 NOTE — Psych (Signed)
[facility] PHP THERAPIST PROGRESS NOTE  Karina [name] [id number]  Session Time: 9:00 - 11:00  Participation Level: Active  Behavioral Response: CasualAlertAnxious  Type of Therapy: Group Therapy  Treatment Goals addressed: Coping  Interventions: CBT, DBT, Solution Focused, Supportive and Reframing  Summary:  Clinician led check-in regarding current stressors and situation, and review of patient completed daily inventory. Clinician utilized active listening and empathetic response and validated patient emotions. Clinician facilitated processing group on pertinent issues.   Therapist Response: Karina [name] is a 23 y.o. female who presents with depression and anxiety symptoms. Patient arrived within time allowed and reports that she is feeling "good." Patient rates her mood at a 8 on a scale of 1-10 with 10 being great. Pt reports her weekend went well and that it was "really busy but good." Pt shares the activity helped keep her mind in check to an extent and the routine was helpful. Pt shares on her way back in to town on Sunday she stopped to have lunch with her friend that lives out of town and that was enjoyable. Pt continues to struggle with giving herself grace when she makes a misstep. Patient engaged in discussion.        Session Time: 11:00 -12:00   Participation Level: Active   Behavioral Response: CasualAlertDepressed   Type of Therapy: Group Therapy, OT   Treatment Goals addressed: Coping   Interventions: Psychosocial skills training, Supportive,    Summary:  Occupational Therapy group   Therapist Response: Patient engaged in group. See OT note.         Session Time: 12:00 - 12:45  Participation Level: Active  Behavioral Response: CasualAlertDepressed  Type of Therapy: Group Therapy, Activity Therapy  Treatment Goals addressed: Coping  Interventions: Psychologist, occupational, Supportive  Summary:  Reflection Group: Patients encouraged to  practice skills and interpersonal techniques or work on mindfulness and relaxation techniques. The importance of self-care and making skills part of a routine to increase usage were stressed   Therapist Response: Patient engaged and participated appropriately.         Session Time: 12:45- 2:00  Participation Level: Active  Behavioral Response: CasualAlertDepressed  Type of Therapy: Group Therapy, Psychoeducation; Psychotherapy  Treatment Goals addressed: Coping  Interventions: CBT; Solution focused; Supportive; Reframing  Summary: 1:00 - 1:50: Clinician introduced topic of self esteem. Group ranked how they view their current self esteem. Cln provided psycho-education on the roots of healthy/unhealthy self esteem. Group reviewed CBT based model for altering core beliefs and addressing the maladaptive rules stemming from low self esteem. Group completed examples from both models.  1:50 -2:00 Clinician led check-out. Clinician assessed for immediate needs, medication compliance and efficacy, and safety concerns   Therapist Response: Patient engaged in activity and discussion. Pt identifies their self esteem as low and rates it a 5 on a 1-10 scale with 10 being great. Pt reports contributing factors to low self esteem as "everything other than work." Pt completed examples for altering core beliefs and maladaptive behaviors and chose not to share with the group.  At check-out, patient rates her mood at a 7 on a scale of 1-10 with 10 being great. Patient reports afternoon plans of considering how she will spend the last few days before returning to work. Patient demonstrates some progress as evidenced by increased recognition of distortions in the moment. Patient denies SI/HI/self-harm at the end of group.        Suicidal/Homicidal: Nowithout intent/plan  Plan: Pt will  continue in PHP while working to decrease depression and anxiety symptoms, increase ability to manage in a healthy  manner, and increase willingness to utilize skills.    Diagnosis: Bipolar 2 disorder (HCC) [F31.81]    1. Bipolar 2 disorder (HCC)       Karina GuilesJenny Ezrael Sam, LCSW 06/06/2018

## 2018-06-06 NOTE — Psych (Signed)
   [facility] PHP THERAPIST PROGRESS NOTE  Karina [name] [id number]  Session Time: 9:00 - 11:00  Participation Level: Active  Behavioral Response: CasualAlertAnxious  Type of Therapy: Group Therapy  Treatment Goals addressed: Coping  Interventions: CBT, DBT, Solution Focused, Supportive and Reframing  Summary:  Clinician led check-in regarding current stressors and situation, and review of patient completed daily inventory. Clinician utilized active listening and empathetic response and validated patient emotions. Clinician facilitated processing group on pertinent issues.   Therapist Response: Karina [name] is a 23 y.o. female who presents with depression and anxiety symptoms. Patient arrived within time allowed and reports that she is feeling "okay." Patient rates her mood at a 7 on a scale of 1-10 with 10 being great. Pt states she is getting concerned about going back to work and getting lost in the day to day. Pt states she will struggle to maintain balance once back at work. Pt states she spent most of yesterday afternoon binging a TV show and "it felt really good" to do "nothing" however she did feel some guilt that she was not productive. Pt states planning intentional time in the next week to decompress in a similar manner.  Patient engaged in discussion.        Session Time: 11:00 -12:00   Participation Level: Active   Behavioral Response: CasualAlertDepressed   Type of Therapy: Group Therapy, OT   Treatment Goals addressed: Coping   Interventions: Psychosocial skills training, Supportive,    Summary:  Occupational Therapy group   Therapist Response: Patient engaged in group. See OT note.         Session Time: 12:00 - 12:45  Participation Level: Active  Behavioral Response: CasualAlertDepressed  Type of Therapy: Group Therapy, Activity Therapy  Treatment Goals addressed: Coping  Interventions: Psychologist, occupational, Supportive  Summary:   Reflection Group: Patients encouraged to practice skills and interpersonal techniques or work on mindfulness and relaxation techniques. The importance of self-care and making skills part of a routine to increase usage were stressed   Therapist Response: Patient engaged and participated appropriately.         Session Time: 12:45- 2:00  Participation Level: Active  Behavioral Response: CasualAlertDepressed  Type of Therapy: Group Therapy, Psychoeducation; Psychotherapy  Treatment Goals addressed: Coping  Interventions: CBT; Solution focused; Supportive; Reframing  Summary: 12:45 - 1:50: Cln introduced topic of vulnerability. Group viewed TED talk, "The Power of Vulnerability" and pt's discussed how the topic plays out in their lives. 12:50 -1:00 Clinician led check-out. Clinician assessed for immediate needs, medication compliance and efficacy, and safety concerns   Therapist Response: Patient engaged in discussion. Pt reports she is aware of her struggle with vulnerability and how it connects to her sense of control.   At check-out, patient rates her mood at a 7 on a scale of 1-10 with 10 being great. Pt reports afternoon plans of relaxing and having dinner with a friend. Patient demonstrates some progress as evidenced by increased focus on self care. Patient denies SI/HI/self-harm thoughts at the end of group.       Suicidal/Homicidal: Nowithout intent/plan  Plan: Pt will continue in PHP while working to decrease depression and anxiety symptoms, increase ability to manage in a healthy manner, and increase willingness to utilize skills.    Diagnosis: Bipolar 2 disorder (HCC) [F31.81]    1. Bipolar 2 disorder (HCC)       [name], LCSW 06/06/2018

## 2018-06-09 NOTE — Psych (Signed)
   [facility] PHP THERAPIST PROGRESS NOTE  Karina [name] [id number]  Session Time: 9:00 - 11:00  Participation Level: Active  Behavioral Response: CasualAlertAnxious  Type of Therapy: Group Therapy  Treatment Goals addressed: Coping  Interventions: CBT, DBT, Solution Focused, Supportive and Reframing  Summary:  Clinician led check-in regarding current stressors and situation, and review of patient completed daily inventory. Clinician utilized active listening and empathetic response and validated patient emotions. Clinician facilitated processing group on pertinent issues.   Therapist Response: [name] is a 22 y.o. female who presents with depression and anxiety symptoms. Patient arrived within time allowed and reports that she is feeling "anxious about discharge". Patient rates her mood at a 6 on a scale of 1-10 with 10 being great. Pt states that she was in a "productive mood" yesterday and spent the afternoon cleaning. Pt states that she then went to dinner with friends and went to bed. Pt states that she had stressful dreams about work which has increased her anxiety this morning. Pt continues to struggle with setting work boundaries. Patient engaged in discussion.         Session Time: 11:00 -12:15  Participation Level: Active  Behavioral Response: CasualAlertDepressed  Type of Therapy: Group Therapy, psychotherapy  Treatment Goals addressed: Coping  Interventions: Strengths based, reframing, Supportive,   Summary:  Spiritual Care group  Therapist Response: Patient engaged in group. See chaplain note.          Session Time: 12:15 - 1:00  Participation Level: Active  Behavioral Response: CasualAlertDepressed  Type of Therapy: Group Therapy  Treatment Goals addressed: Coping  Interventions: Psychologist, occupational, Supportive  Summary:  Reflection Group: Patients encouraged to practice skills and interpersonal techniques or work on mindfulness  and relaxation techniques. The importance of self-care and making skills part of a routine to increase usage were stressed   Therapist Response: Patient engaged and participated appropriately.         Session Time: 1:00- 2:00  Participation Level: Active  Behavioral Response: CasualAlertDepressed  Type of Therapy: Group Therapy, Psychoeducation  Treatment Goals addressed: Coping  Interventions: relaxation training; Supportive; Reframing  Summary: 12:45 - 1:50: Relaxation group: Cln led group focused on retraining the body's response to stress.   1:50 -2:00 Clinician led check-out. Clinician assessed for immediate needs, medication compliance and efficacy, and safety concerns   Therapist Response: Patient engaged in activity and discussion. At check-out, patient rates her mood at a 6.5 on a scale of 1-10 with 10 being great. Patient reports afternoon plans of "chilling out" until an evening appointment. Patient demonstrates some progress as evidenced by continued use of skills to help with acceptance. Patient denies SI/HI/self-harm thoughts at the end of group.        Suicidal/Homicidal: Nowithout intent/plan  Plan: Pt will discharge from PHP due to meeting treatment goals of reported decrease in depression and anxiety symptoms as well as increased ability to manage these symptoms. Pt has declined IOP and will step down to outpatient treatment. Pt will return to previous providers and has follow up appointments scheduled. Pt and provider are aligned with discharge. Pt denies SI/HI at time of discharge.    Diagnosis: Bipolar 2 disorder (HCC) [F31.81]    1. Bipolar 2 disorder (HCC)       [name], LCSW 06/09/2018
